# Patient Record
Sex: Female | Born: 1985 | Race: White | Hispanic: No | Marital: Single | Smoking: Current every day smoker
Health system: Southern US, Community
[De-identification: ages and names within clinical notes are randomized; demographics above are authoritative.]

## PROBLEM LIST (undated history)

## (undated) DIAGNOSIS — D649 Anemia, unspecified: Secondary | ICD-10-CM

## (undated) DIAGNOSIS — K259 Gastric ulcer, unspecified as acute or chronic, without hemorrhage or perforation: Secondary | ICD-10-CM

## (undated) HISTORY — PX: GASTRIC BYPASS: SHX52

---

## 2019-11-15 ENCOUNTER — Other Ambulatory Visit: Payer: Self-pay

## 2019-11-15 DIAGNOSIS — D649 Anemia, unspecified: Secondary | ICD-10-CM | POA: Insufficient documentation

## 2019-11-15 DIAGNOSIS — Z20822 Contact with and (suspected) exposure to covid-19: Secondary | ICD-10-CM | POA: Insufficient documentation

## 2019-11-15 DIAGNOSIS — K922 Gastrointestinal hemorrhage, unspecified: Secondary | ICD-10-CM | POA: Insufficient documentation

## 2019-11-15 DIAGNOSIS — R1012 Left upper quadrant pain: Secondary | ICD-10-CM | POA: Insufficient documentation

## 2019-11-15 DIAGNOSIS — F17200 Nicotine dependence, unspecified, uncomplicated: Secondary | ICD-10-CM | POA: Insufficient documentation

## 2019-11-15 LAB — POCT PREGNANCY, URINE: Preg Test, Ur: NEGATIVE

## 2019-11-15 MED ORDER — SODIUM CHLORIDE 0.9% FLUSH
3.0000 mL | Freq: Once | INTRAVENOUS | Status: AC
  Administered 2019-11-16: 3 mL via INTRAVENOUS

## 2019-11-15 NOTE — ED Triage Notes (Signed)
Pt arrives to ED via POV from home with c/o non-radiating LUQ abdominal pain x2 hrs. Pt denies N/V/D. No urinary cahnges or c/o's. Pt denies any c/o SHOB or CP. Pt reports h/x of gastric bypass and ulcers, but no other abdominal surgeries. Pt is A&O, in NAD; RR even, regular, and unlabored.

## 2019-11-16 ENCOUNTER — Emergency Department
Admission: EM | Admit: 2019-11-16 | Discharge: 2019-11-16 | Disposition: A | Discharge status: Other Acute Inpatient Hospital | Payer: Self-pay | Attending: Emergency Medicine | Admitting: Emergency Medicine

## 2019-11-16 ENCOUNTER — Emergency Department: Payer: Self-pay

## 2019-11-16 ENCOUNTER — Encounter: Payer: Self-pay | Admitting: Radiology

## 2019-11-16 DIAGNOSIS — K922 Gastrointestinal hemorrhage, unspecified: Secondary | ICD-10-CM

## 2019-11-16 DIAGNOSIS — R1012 Left upper quadrant pain: Secondary | ICD-10-CM

## 2019-11-16 DIAGNOSIS — K275 Chronic or unspecified peptic ulcer, site unspecified, with perforation: Secondary | ICD-10-CM

## 2019-11-16 DIAGNOSIS — D649 Anemia, unspecified: Secondary | ICD-10-CM

## 2019-11-16 HISTORY — DX: Gastric ulcer, unspecified as acute or chronic, without hemorrhage or perforation: K25.9

## 2019-11-16 HISTORY — DX: Anemia, unspecified: D64.9

## 2019-11-16 LAB — LACTIC ACID, PLASMA
Lactic Acid, Venous: 1 mmol/L (ref 0.5–1.9)
Lactic Acid, Venous: 1.1 mmol/L (ref 0.5–1.9)

## 2019-11-16 LAB — PROTIME-INR
INR: 1.1 (ref 0.8–1.2)
Prothrombin Time: 13.9 seconds (ref 11.4–15.2)

## 2019-11-16 LAB — CBC
HCT: 18.1 % — ABNORMAL LOW (ref 36.0–46.0)
HCT: 18.6 % — ABNORMAL LOW (ref 36.0–46.0)
HCT: 19.6 % — ABNORMAL LOW (ref 36.0–46.0)
Hemoglobin: 4.6 g/dL — CL (ref 12.0–15.0)
Hemoglobin: 5 g/dL — ABNORMAL LOW (ref 12.0–15.0)
Hemoglobin: 5.3 g/dL — ABNORMAL LOW (ref 12.0–15.0)
MCH: 16.9 pg — ABNORMAL LOW (ref 26.0–34.0)
MCH: 17.5 pg — ABNORMAL LOW (ref 26.0–34.0)
MCH: 19.1 pg — ABNORMAL LOW (ref 26.0–34.0)
MCHC: 25.4 g/dL — ABNORMAL LOW (ref 30.0–36.0)
MCHC: 26.9 g/dL — ABNORMAL LOW (ref 30.0–36.0)
MCHC: 27 g/dL — ABNORMAL LOW (ref 30.0–36.0)
MCV: 65.3 fL — ABNORMAL LOW (ref 80.0–100.0)
MCV: 66.5 fL — ABNORMAL LOW (ref 80.0–100.0)
MCV: 70.8 fL — ABNORMAL LOW (ref 80.0–100.0)
Platelets: 224 10*3/uL (ref 150–400)
Platelets: 314 10*3/uL (ref 150–400)
Platelets: 317 10*3/uL (ref 150–400)
RBC: 2.72 MIL/uL — ABNORMAL LOW (ref 3.87–5.11)
RBC: 2.77 MIL/uL — ABNORMAL LOW (ref 3.87–5.11)
RBC: 2.85 MIL/uL — ABNORMAL LOW (ref 3.87–5.11)
RDW: 18.1 % — ABNORMAL HIGH (ref 11.5–15.5)
RDW: 18.4 % — ABNORMAL HIGH (ref 11.5–15.5)
RDW: 20 % — ABNORMAL HIGH (ref 11.5–15.5)
WBC: 6.2 10*3/uL (ref 4.0–10.5)
WBC: 6.5 10*3/uL (ref 4.0–10.5)
WBC: 7.7 10*3/uL (ref 4.0–10.5)
nRBC: 0 % (ref 0.0–0.2)
nRBC: 0 % (ref 0.0–0.2)
nRBC: 0 % (ref 0.0–0.2)

## 2019-11-16 LAB — URINALYSIS, COMPLETE (UACMP) WITH MICROSCOPIC
Bacteria, UA: NONE SEEN
Bilirubin Urine: NEGATIVE
Glucose, UA: NEGATIVE mg/dL
Hgb urine dipstick: NEGATIVE
Ketones, ur: NEGATIVE mg/dL
Leukocytes,Ua: NEGATIVE
Nitrite: NEGATIVE
Protein, ur: NEGATIVE mg/dL
Specific Gravity, Urine: 1.025 (ref 1.005–1.030)
pH: 6 (ref 5.0–8.0)

## 2019-11-16 LAB — ABO/RH: ABO/RH(D): A POS

## 2019-11-16 LAB — COMPREHENSIVE METABOLIC PANEL
ALT: 19 U/L (ref 0–44)
AST: 17 U/L (ref 15–41)
Albumin: 3.2 g/dL — ABNORMAL LOW (ref 3.5–5.0)
Alkaline Phosphatase: 100 U/L (ref 38–126)
Anion gap: 9 (ref 5–15)
BUN: 21 mg/dL — ABNORMAL HIGH (ref 6–20)
CO2: 27 mmol/L (ref 22–32)
Calcium: 8.2 mg/dL — ABNORMAL LOW (ref 8.9–10.3)
Chloride: 101 mmol/L (ref 98–111)
Creatinine, Ser: 0.8 mg/dL (ref 0.44–1.00)
GFR calc Af Amer: >60 mL/min (ref >60)
GFR calc non Af Amer: >60 mL/min (ref >60)
Glucose, Bld: 120 mg/dL — ABNORMAL HIGH (ref 70–99)
Potassium: 4.2 mmol/L (ref 3.5–5.1)
Sodium: 137 mmol/L (ref 135–145)
Total Bilirubin: 0.5 mg/dL (ref 0.3–1.2)
Total Protein: 7.5 g/dL (ref 6.5–8.1)

## 2019-11-16 LAB — LIPASE, BLOOD: Lipase: 33 U/L (ref 11–51)

## 2019-11-16 LAB — PREGNANCY, URINE: Preg Test, Ur: NEGATIVE

## 2019-11-16 LAB — SARS CORONAVIRUS 2 BY RT PCR (HOSPITAL ORDER, PERFORMED IN ~~LOC~~ HOSPITAL LAB): SARS Coronavirus 2: NEGATIVE

## 2019-11-16 LAB — PREPARE RBC (CROSSMATCH)

## 2019-11-16 MED ORDER — SODIUM CHLORIDE 0.9 % IV SOLN
10.0000 mL/h | Freq: Once | INTRAVENOUS | Status: AC
  Administered 2019-11-16: 10 mL/h via INTRAVENOUS

## 2019-11-16 MED ORDER — IOHEXOL 9 MG/ML PO SOLN
500.0000 mL | Freq: Two times a day (BID) | ORAL | Status: DC | PRN
  Administered 2019-11-16: 500 mL via ORAL

## 2019-11-16 MED ORDER — ONDANSETRON HCL 4 MG/2ML IJ SOLN
4.0000 mg | Freq: Once | INTRAMUSCULAR | Status: AC
  Administered 2019-11-16: 4 mg via INTRAVENOUS
  Filled 2019-11-16: qty 2

## 2019-11-16 MED ORDER — MORPHINE SULFATE (PF) 4 MG/ML IV SOLN
INTRAVENOUS | Status: AC
  Filled 2019-11-16: qty 1

## 2019-11-16 MED ORDER — MORPHINE SULFATE (PF) 4 MG/ML IV SOLN
4.0000 mg | Freq: Once | INTRAVENOUS | Status: AC
  Administered 2019-11-16: 4 mg via INTRAVENOUS
  Filled 2019-11-16: qty 1

## 2019-11-16 MED ORDER — IOHEXOL 300 MG/ML  SOLN
100.0000 mL | Freq: Once | INTRAMUSCULAR | Status: AC | PRN
  Administered 2019-11-16: 100 mL via INTRAVENOUS

## 2019-11-16 MED ORDER — SODIUM CHLORIDE 0.9 % IV SOLN
80.0000 mg | Freq: Once | INTRAVENOUS | Status: AC
  Administered 2019-11-16: 80 mg via INTRAVENOUS
  Filled 2019-11-16: qty 80

## 2019-11-16 MED ORDER — SODIUM CHLORIDE 0.9 % IV SOLN
8.0000 mg/h | INTRAVENOUS | Status: DC
  Administered 2019-11-16: 8 mg/h via INTRAVENOUS
  Filled 2019-11-16 (×2): qty 80

## 2019-11-16 MED ORDER — SODIUM CHLORIDE 0.9 % IV BOLUS
1000.0000 mL | Freq: Once | INTRAVENOUS | Status: AC
  Administered 2019-11-16: 1000 mL via INTRAVENOUS

## 2019-11-16 MED ORDER — PIPERACILLIN-TAZOBACTAM 3.375 G IVPB 30 MIN
3.3750 g | Freq: Once | INTRAVENOUS | Status: AC
  Administered 2019-11-16: 3.375 g via INTRAVENOUS
  Filled 2019-11-16: qty 50

## 2019-11-16 MED ORDER — MORPHINE SULFATE (PF) 4 MG/ML IV SOLN
4.0000 mg | Freq: Once | INTRAVENOUS | Status: AC
  Administered 2019-11-16: 4 mg via INTRAVENOUS
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
## 2019-11-16 NOTE — ED Notes (Signed)
As per Dr. Dolores Frame. Second PRBCs to be HELD and not given at this time.

## 2019-11-16 NOTE — ED Provider Notes (Signed)
[facility] Emergency Department Provider Note   ____________________________________________   First MD Initiated Contact with Patient 11/16/19 0017     (approximate)  I have reviewed the triage vital signs and the nursing notes.   HISTORY  Chief Complaint Abdominal Pain    Tara Henry is a 34 y.o. female who presents to the ED from home with a chief complaint of abdominal pain.  Patient reports nonradiating left upper quadrant abdominal pain for the past several hours.  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  Denies fever, chills, chest pain, shortness of breath, nausea, vomiting or bloody stools.  Denies NSAID use.  Denies EtOH.     Patient had EGD done at [facility] in 02/2018 which demonstrated Roux-en-Y gastrojejunostomy with gastrojejunal anastomosis characterized by erosion, ulceration and intact staple line.   She was treated with Prilosec.   Past Medical History:  Diagnosis Date  . Anemia   . Stomach ulcer     There are no problems to display for this patient.   Past Surgical History:  Procedure Laterality Date  . GASTRIC BYPASS      Prior to Admission medications   Not on File    Allergies Patient has no known allergies.  No family history on file.  Social History Social History   Tobacco Use  . Smoking status: Current Every Day Smoker  . Smokeless tobacco: Never Used  Substance Use Topics  . Alcohol use: Not on file  . Drug use: Not on file    Review of Systems  Constitutional: No fever/chills Eyes: No visual changes. ENT: No sore throat. Cardiovascular: Denies chest pain. Respiratory: Denies shortness of breath. Gastrointestinal: Positive for abdominal pain.  No nausea, no vomiting.  No diarrhea.  No constipation. Genitourinary: Negative for dysuria. Musculoskeletal: Negative for back pain. Skin: Negative for rash. Neurological: Negative for headaches, focal weakness or  numbness.   ____________________________________________   PHYSICAL EXAM:  VITAL SIGNS: ED Triage Vitals  Enc Vitals Group     BP 11/15/19 2332 121/81     Pulse Rate 11/15/19 2332 97     Resp 11/15/19 2332 18     Temp 11/15/19 2332 98.7 F (37.1 C)     Temp Source 11/15/19 2332 Oral     SpO2 11/15/19 2332 100 %     Weight 11/15/19 2333 130 lb (59 kg)     Height 11/15/19 2333 5\' 2"  (1.575 m)     Head Circumference --      Peak Flow --      Pain Score 11/15/19 2342 10     Pain Loc --      Pain Edu? --      Excl. in GC? --     Constitutional: Alert and oriented.  Cachectic appearing and in mild acute distress. Eyes: Conjunctivae are normal. PERRL. EOMI. Head: Atraumatic. Nose: No congestion/rhinnorhea. Mouth/Throat: Mucous membranes are moist.   Neck: No stridor.   Cardiovascular: Normal rate, regular rhythm. Grossly normal heart sounds.  Good peripheral circulation. Respiratory: Normal respiratory effort.  No retractions. Lungs CTAB. Gastrointestinal: Soft and moderately tender to palpation left upper quadrant without rebound or guarding. No distention. No abdominal bruits. No CVA tenderness. Rectal: External exam unremarkable.  Brown stool on gloved finger which is heme positive. Musculoskeletal: No lower extremity tenderness nor edema.  No joint effusions. Neurologic:  Normal speech and language. No gross focal neurologic deficits are appreciated. No gait instability. Skin:  Skin is warm, dry and intact. No rash noted. Psychiatric: Mood and affect are normal. Speech and behavior are normal.  ____________________________________________   LABS (all labs ordered are listed, but only abnormal results are displayed)  Labs Reviewed  COMPREHENSIVE METABOLIC PANEL - Abnormal; Notable for the following components:      Result Value   Glucose, Bld 120 (*)    BUN 21 (*)    Calcium 8.2 (*)    Albumin 3.2 (*)    All other components within normal limits  CBC - Abnormal;  Notable for the following components:   RBC 2.72 (*)    Hemoglobin 4.6 (*)    HCT 18.1 (*)    MCV 66.5 (*)    MCH 16.9 (*)    MCHC 25.4 (*)    RDW 18.1 (*)    All other components within normal limits  URINALYSIS, COMPLETE (UACMP) WITH MICROSCOPIC - Abnormal; Notable for the following components:   Color, Urine YELLOW (*)    APPearance HAZY (*)    All other components within normal limits  CBC - Abnormal; Notable for the following components:   RBC 2.85 (*)    Hemoglobin 5.0 (*)    HCT 18.6 (*)    MCV 65.3 (*)    MCH 17.5 (*)    MCHC 26.9 (*)    RDW 18.4 (*)    All other components within normal limits  CBC - Abnormal; Notable for the following components:   RBC 2.77 (*)    Hemoglobin 5.3 (*)    HCT 19.6 (*)    MCV 70.8 (*)    MCH 19.1 (*)    MCHC 27.0 (*)    RDW 20.0 (*)    All other components within normal limits  SARS CORONAVIRUS 2 BY RT PCR (HOSPITAL ORDER, PERFORMED IN [facility])  CULTURE, BLOOD (ROUTINE X 2)  CULTURE, BLOOD (ROUTINE X 2)  LIPASE, BLOOD  PROTIME-INR  PREGNANCY, URINE  LACTIC ACID, PLASMA  LACTIC ACID, PLASMA  POCT PREGNANCY, URINE  TYPE AND SCREEN  PREPARE RBC (CROSSMATCH)  ABO/RH   ____________________________________________  EKG  ED ECG REPORT I, Glendia Olshefski J, the attending physician, personally viewed and interpreted this ECG.   Date: 11/16/2019  EKG Time: 0114  Rate: 80  Rhythm: normal EKG, normal sinus rhythm  Axis: RAD  Intervals:none  ST&T Change: Nonspecific  ____________________________________________  RADIOLOGY  ED MD interpretation: Retroperitoneal gas concerning for perforated ulcer in the afferent limb, anasarca, cholelithiasis  Official radiology report(s): CT Abdomen Pelvis W Contrast  Result Date: 11/16/2019 CLINICAL DATA:  Left upper quadrant pain for 2 hours EXAM: CT ABDOMEN AND PELVIS WITH CONTRAST TECHNIQUE: Multidetector CT imaging of the abdomen and pelvis was performed using the standard  protocol following bolus administration of intravenous contrast. Patient scanned in a right lateral decubitus position. CONTRAST:  OMNIPAQUE IOHEXOL 300 MG/ML  SOLN COMPARISON:  None FINDINGS: Lower chest: Lung bases are clear. Normal heart size. No pericardial effusion. Hepatobiliary: Focal fatty infiltration along the falciform ligament. Normal hepatic attenuation. Smooth liver surface contour. No worrisome liver lesions. Gallbladder appears largely contracted around numerous calcified gallstones with some mild pericholecystic fluid, this may be redistributed or reactive. Pancreas: Unremarkable. No pancreatic ductal dilatation or surrounding inflammatory changes. Spleen: Normal in size without focal abnormality. Adrenals/Urinary Tract: Normal adrenal glands. Kidneys are normally located with symmetric enhancement. No suspicious renal lesion, urolithiasis or hydronephrosis. Mild thickening of the bladder wall may be related to underdistention. Stomach/Bowel: Bowel is  poorly assessed due to diffuse edematous changes throughout the mesentery and a collapsed appearance of much of the bowel. There are postsurgical changes from what appear to be in anti colic Roux-en-Y gastric bypass. There is irregular mucosal hyperemia and possible thickening of the excluded gastric segment of the biliopancreatic limb with a layering air and fluid collection about the duodenum with foci of gas tracking inferiorly from this collection along the right aortic margin. Could reflect sequela of a perforated ulcer within the afferent limb. Much of the distal small bowel is decompressed. Moderate colonic stool burden. No frank colonic wall thickening or dilatation. Foci of gas seen tracking into the posterior retroperitoneum inferiorly as well. Vascular/Lymphatic: No significant vascular findings are present. Adenopathy is difficult to assess given the diffuse edematous changes. Pathologically enlarged nodes in the abdomen or pelvis.  Reproductive: Anteverted uterus with IUD in appropriate position. No concerning adnexal lesions. Other: Diffuse severe body wall and mesenteric edematous changes. Free air largely contained within the retroperitoneum as above with an air and fluid containing collection about the 1st to 2nd portions of the duodenum and adjacent the gallbladder fossa (7/32) measuring approximately 4.8 x 2.1 x 3 cm in size. No bowel containing hernias. Musculoskeletal: Levocurvature of the lower lumbar spine. No acute osseous abnormality or suspicious osseous lesion. IMPRESSION: 1. Postsurgical changes from antecolic Roux-en-Y gastric bypass. 2. There is irregular mucosal hyperemia and possible thickening of the excluded gastric segment of the biliopancreatic limb with poor definition of the duodenum with a layering air and fluid containing the second portion of the duodenum with foci of gas tracking inferiorly predominately within the retroperitoneum and extending along the right aortic margin. Could reflect sequela of a perforated ulcer within the afferent limb. 3. Diffuse severe body wall and mesenteric edematous changes, consistent with anasarca. 4. Cholelithiasis with some mild pericholecystic fluid, this may be redistributed or reactive. If there is clinical concern for acute cholecystitis, consider further evaluation with right upper quadrant ultrasound. 5. Mild thickening of the bladder wall may be related to underdistention. Correlate with urinalysis to exclude cystitis. Critical Value/emergent results were called by telephone at the time of interpretation on 11/16/2019 at 3:10 am to provider Allani Reber , who verbally acknowledged these results. Electronically Signed   By: Kreg Shropshire M.D.   On: 11/16/2019 03:11    ____________________________________________   PROCEDURES  Procedure(s) performed (including Critical Care):  .1-3 Lead EKG Interpretation Performed by: Irean Hong, MD Authorized by: Irean Hong, MD      Interpretation: normal     ECG rate:  95   ECG rate assessment: normal     Rhythm: sinus rhythm     Ectopy: none     Conduction: normal   Comments:     Patient placed on cardiac monitor to evaluate for arrhythmias   CRITICAL CARE Performed by: Irean Hong   Total critical care time: 60 minutes  Critical care time was exclusive of separately billable procedures and treating other patients.  Critical care was necessary to treat or prevent imminent or life-threatening deterioration.  Critical care was time spent personally by me on the following activities: development of treatment plan with patient and/or surrogate as well as nursing, discussions with consultants, evaluation of patient's response to treatment, examination of patient, obtaining history from patient or surrogate, ordering and performing treatments and interventions, ordering and review of laboratory studies, ordering and review of radiographic studies, pulse oximetry and re-evaluation of patient's condition.   ____________________________________________  INITIAL IMPRESSION / ASSESSMENT AND PLAN / ED COURSE  As part of my medical decision making, I reviewed the following data within the electronic MEDICAL RECORD NUMBER Nursing notes reviewed and incorporated, Labs reviewed, EKG interpreted, Old chart reviewed, Radiograph reviewed and Notes from prior ED visits     Larose Hiresshley E Vandyne was evaluated in Emergency Department on 11/16/2019 for the symptoms described in the history of present illness. She was evaluated in the context of the global COVID-19 pandemic, which necessitated consideration that the patient might be at risk for infection with the SARS-CoV-2 virus that causes COVID-19. Institutional protocols and algorithms that pertain to the evaluation of patients at risk for COVID-19 are in a state of rapid change based on information released by regulatory bodies including the CDC and federal and state organizations. These  policies and algorithms were followed during the patient's care in the ED.    34 year old female with PUD presenting with LUQ pain. Differential diagnosis includes, but is not limited to, biliary disease (biliary colic, acute cholecystitis, cholangitis, choledocholithiasis, etc), intrathoracic causes for epigastric abdominal pain including ACS, gastritis, duodenitis, pancreatitis, small bowel or large bowel obstruction, abdominal aortic aneurysm, hernia, and ulcer(s).  Initial H/H 4.6/18.1.  Will repeat CBC.  Obtain type and screen, PT/INR, Covid screen.  Will obtain CT abdomen/pelvis.  If there is another suggestion of inflammation/erosion at the gastrojejunal anastomosis, the patient may be better served with [facility].   Clinical Course as of Nov 16 615  Tue Nov 16, 2019  [id number] Discussed case with surgery on-call Dr. Lady Garyannon who [facility] as we do not have bariatric services at this facility.  Agrees with dose of IV Zosyn.  [facility] transfer center contacted.   [JS]  [id number] [facility] Dr[facility] Ferd GlassingFarrell who recommends ED to ED transfer.  Accepted by Dr. Alvino Chapelhoi.  Patient updated.  Will check H/H after first unit PRBCs.   [JS]  [id number] Transport unavailable until after 7 AM.  Hemoglobin still under 7; will transfuse second unit PRBC.   [JS]    Clinical Course User Index [JS] Irean HongSung, Marlet Korte J, MD     ____________________________________________   FINAL CLINICAL IMPRESSION(S) / ED DIAGNOSES  Final diagnoses:  Left upper quadrant abdominal pain  UGIB (upper gastrointestinal bleed)  Anemia, unspecified type  Perforated ulcer [facility](HCC)     ED Discharge Orders    None       Note:  This document was prepared using Dragon voice recognition software and may include unintentional dictation errors.   Irean HongSung, Adriane Gabbert J, MD 11/16/19 [id number]

## 2019-11-16 NOTE — Progress Notes (Signed)
Patient with 1 day history of abdominal pain. Has history of RNY GBP with prior care at [facility] for anastomotic ulceration.  CT concerning for ulcer, possible perforation.  Patient with hgb of 5, normal WBC and normal vital signs.  Discussed with ED provider, Dr. Dolores Frame.  As this appears to be a complication related to prior bariatric surgery and she is hemodynamically stable, recommend transfer to [facility] for further evaluation and management.  Agree with transfusion and empiric broad-spectrum abx.

## 2019-11-16 NOTE — ED Notes (Signed)
Second unit of PRBCs to be given as per ER Provider

## 2019-11-17 LAB — TYPE AND SCREEN
ABO/RH(D): A POS
Antibody Screen: NEGATIVE
Unit division: 0
Unit division: 0

## 2019-11-17 LAB — BPAM RBC
Blood Product Expiration Date: 202107082359
Blood Product Expiration Date: 202107102359
ISSUE DATE / TIME: 202107060220
ISSUE DATE / TIME: 202107060548
Unit Type and Rh: 5100
Unit Type and Rh: 6200

## 2019-11-17 NOTE — ED Notes (Signed)
Positive blood culture called to [place] at [facility].

## 2019-11-19 LAB — CULTURE, BLOOD (ROUTINE X 2)

## 2019-11-21 LAB — CULTURE, BLOOD (ROUTINE X 2): Culture: NO GROWTH

## 2020-01-18 ENCOUNTER — Emergency Department: Payer: Medicaid Other

## 2020-01-18 ENCOUNTER — Encounter: Payer: Self-pay | Admitting: Emergency Medicine

## 2020-01-18 ENCOUNTER — Other Ambulatory Visit: Payer: Self-pay

## 2020-01-18 ENCOUNTER — Emergency Department
Admission: EM | Admit: 2020-01-18 | Discharge: 2020-01-18 | Payer: Medicaid Other | Attending: Emergency Medicine | Admitting: Emergency Medicine

## 2020-01-18 DIAGNOSIS — R109 Unspecified abdominal pain: Secondary | ICD-10-CM | POA: Diagnosis present

## 2020-01-18 DIAGNOSIS — Z79899 Other long term (current) drug therapy: Secondary | ICD-10-CM | POA: Insufficient documentation

## 2020-01-18 DIAGNOSIS — Z91040 Latex allergy status: Secondary | ICD-10-CM | POA: Diagnosis not present

## 2020-01-18 DIAGNOSIS — R509 Fever, unspecified: Secondary | ICD-10-CM | POA: Insufficient documentation

## 2020-01-18 DIAGNOSIS — K251 Acute gastric ulcer with perforation: Secondary | ICD-10-CM | POA: Insufficient documentation

## 2020-01-18 DIAGNOSIS — A419 Sepsis, unspecified organism: Secondary | ICD-10-CM | POA: Insufficient documentation

## 2020-01-18 DIAGNOSIS — Z20822 Contact with and (suspected) exposure to covid-19: Secondary | ICD-10-CM | POA: Diagnosis not present

## 2020-01-18 DIAGNOSIS — K922 Gastrointestinal hemorrhage, unspecified: Secondary | ICD-10-CM

## 2020-01-18 DIAGNOSIS — K275 Chronic or unspecified peptic ulcer, site unspecified, with perforation: Secondary | ICD-10-CM

## 2020-01-18 LAB — POCT PREGNANCY, URINE: Preg Test, Ur: NEGATIVE

## 2020-01-18 LAB — BLOOD CULTURE ID PANEL (REFLEXED) - BCID2

## 2020-01-18 LAB — URINALYSIS, COMPLETE (UACMP) WITH MICROSCOPIC
Bacteria, UA: NONE SEEN
Bilirubin Urine: NEGATIVE
Glucose, UA: NEGATIVE mg/dL
Hgb urine dipstick: NEGATIVE
Ketones, ur: NEGATIVE mg/dL
Leukocytes,Ua: NEGATIVE
Nitrite: NEGATIVE
Protein, ur: NEGATIVE mg/dL
Specific Gravity, Urine: 1.011 (ref 1.005–1.030)
pH: 5 (ref 5.0–8.0)

## 2020-01-18 LAB — SARS CORONAVIRUS 2 BY RT PCR (HOSPITAL ORDER, PERFORMED IN ~~LOC~~ HOSPITAL LAB): SARS Coronavirus 2: NEGATIVE

## 2020-01-18 LAB — COMPREHENSIVE METABOLIC PANEL
ALT: 10 U/L (ref 0–44)
AST: 14 U/L — ABNORMAL LOW (ref 15–41)
Albumin: 2.4 g/dL — ABNORMAL LOW (ref 3.5–5.0)
Alkaline Phosphatase: 66 U/L (ref 38–126)
Anion gap: 9 (ref 5–15)
BUN: 11 mg/dL (ref 6–20)
CO2: 25 mmol/L (ref 22–32)
Calcium: 7.8 mg/dL — ABNORMAL LOW (ref 8.9–10.3)
Chloride: 100 mmol/L (ref 98–111)
Creatinine, Ser: 0.38 mg/dL — ABNORMAL LOW (ref 0.44–1.00)
GFR calc Af Amer: >60 mL/min (ref >60)
GFR calc non Af Amer: >60 mL/min (ref >60)
Glucose, Bld: 108 mg/dL — ABNORMAL HIGH (ref 70–99)
Potassium: 4 mmol/L (ref 3.5–5.1)
Sodium: 134 mmol/L — ABNORMAL LOW (ref 135–145)
Total Bilirubin: 0.5 mg/dL (ref 0.3–1.2)
Total Protein: 6.9 g/dL (ref 6.5–8.1)

## 2020-01-18 LAB — LACTIC ACID, PLASMA
Lactic Acid, Venous: 1.6 mmol/L (ref 0.5–1.9)
Lactic Acid, Venous: 1.3 mmol/L (ref 0.5–1.9)

## 2020-01-18 LAB — PROTIME-INR
INR: 1.3 — ABNORMAL HIGH (ref 0.8–1.2)
Prothrombin Time: 16.1 seconds — ABNORMAL HIGH (ref 11.4–15.2)

## 2020-01-18 LAB — LIPASE, BLOOD: Lipase: 37 U/L (ref 11–51)

## 2020-01-18 LAB — APTT: aPTT: 36 s (ref 24–36)

## 2020-01-18 LAB — HCG, QUANTITATIVE, PREGNANCY: hCG, Beta Chain, Quant, S: <1 m[IU]/mL (ref <5)

## 2020-01-18 MED ORDER — LACTATED RINGERS IV BOLUS
1000.0000 mL | Freq: Once | INTRAVENOUS | Status: AC
  Administered 2020-01-18: 1000 mL via INTRAVENOUS

## 2020-01-18 MED ORDER — SODIUM CHLORIDE 0.9 % IV SOLN
8.0000 mg/h | INTRAVENOUS | Status: DC
  Administered 2020-01-18: 8 mg/h via INTRAVENOUS
  Filled 2020-01-18: qty 80

## 2020-01-18 MED ORDER — SODIUM CHLORIDE 0.9 % IV SOLN
10.0000 mL/h | Freq: Once | INTRAVENOUS | Status: AC
  Administered 2020-01-18: 10 mL/h via INTRAVENOUS

## 2020-01-18 MED ORDER — ONDANSETRON HCL 4 MG/2ML IJ SOLN
4.0000 mg | Freq: Once | INTRAMUSCULAR | Status: AC
  Administered 2020-01-18: 4 mg via INTRAVENOUS
  Filled 2020-01-18: qty 2

## 2020-01-18 MED ORDER — PANTOPRAZOLE SODIUM 40 MG IV SOLR: 40.0000 mg | Freq: Two times a day (BID) | INTRAVENOUS | Status: DC

## 2020-01-18 MED ORDER — LACTATED RINGERS IV SOLN: INTRAVENOUS | Status: DC

## 2020-01-18 MED ORDER — ACETAMINOPHEN 500 MG PO TABS
1000.0000 mg | ORAL_TABLET | Freq: Once | ORAL | Status: AC
  Administered 2020-01-18: 1000 mg via ORAL
  Filled 2020-01-18: qty 2

## 2020-01-18 MED ORDER — IOHEXOL 300 MG/ML  SOLN
100.0000 mL | Freq: Once | INTRAMUSCULAR | Status: AC | PRN
  Administered 2020-01-18: 100 mL via INTRAVENOUS

## 2020-01-18 MED ORDER — PIPERACILLIN-TAZOBACTAM 3.375 G IVPB 30 MIN
3.3750 g | Freq: Once | INTRAVENOUS | Status: AC
  Administered 2020-01-18: 3.375 g via INTRAVENOUS
  Filled 2020-01-18: qty 50

## 2020-01-18 MED ORDER — HYDROMORPHONE HCL 1 MG/ML IJ SOLN
1.0000 mg | Freq: Once | INTRAMUSCULAR | Status: AC
  Administered 2020-01-18: 1 mg via INTRAVENOUS
  Filled 2020-01-18: qty 1

## 2020-01-18 MED ORDER — SODIUM CHLORIDE 0.9 % IV SOLN
80.0000 mg | Freq: Once | INTRAVENOUS | Status: AC
  Administered 2020-01-18: 80 mg via INTRAVENOUS
  Filled 2020-01-18: qty 80

## 2020-01-18 MED ORDER — MORPHINE SULFATE (PF) 4 MG/ML IV SOLN
4.0000 mg | Freq: Once | INTRAVENOUS | Status: AC
  Administered 2020-01-18: 4 mg via INTRAVENOUS
  Filled 2020-01-18: qty 1

## 2020-01-18 NOTE — Progress Notes (Signed)
CODE SEPSIS - PHARMACY COMMUNICATION  **Broad Spectrum Antibiotics should be administered within 1 hour of Sepsis diagnosis**  Time Code Sepsis Called/Page Received: 0707  Antibiotics Ordered: zosyn  Time of 1st antibiotic administration: 0509  Additional action taken by pharmacy:   If necessary, Name of Provider/Nurse Contacted:     Thomasene Ripple ,PharmD Clinical Pharmacist  01/18/2020  7:14 AM

## 2020-01-18 NOTE — ED Triage Notes (Signed)
Pt to triage via w/c; EMS brought pt in from home; left AMA mo ago for GI bleeding; abd distended and firm, pitting edema to extremities; pt appears uncomfortable, restless; c/o left sided abd & back pain x 3 days accomp by vomiting blood and rectal bleeding

## 2020-01-18 NOTE — ED Notes (Signed)
Report given to [facility] ED [facility]

## 2020-01-18 NOTE — ED Notes (Signed)
Pt had urinated on herself. Pt cleaned up and clean gown and brief placed. Pt given warm blankets

## 2020-01-18 NOTE — ED Notes (Signed)
Pt placed in gown and given warm blankets. External cath placed.

## 2020-01-18 NOTE — ED Notes (Signed)
Paper consent to transfer signed and in chart

## 2020-01-18 NOTE — ED Provider Notes (Signed)
-----------------------------------------   8:02 AM on 01/18/2020 -----------------------------------------  ED ECG REPORT I, Merwyn Katos, the attending physician, personally viewed and interpreted this ECG.  Date: 01/18/2020 EKG Time: 0751 Rate: 102 Rhythm: normal sinus rhythm QRS Axis: normal Intervals: normal ST/T Wave abnormalities: normal Narrative Interpretation: no evidence of acute ischemia

## 2020-01-18 NOTE — ED Notes (Signed)
EMTALA reviewed by charge RN 

## 2020-01-18 NOTE — ED Provider Notes (Signed)
[facility] Emergency Department Provider Note  ____________________________________________  Time seen: Approximately 5:18 AM  I have reviewed the triage vital signs and the nursing notes.   HISTORY  Chief Complaint Abdominal Pain   HPI CESIA ORF is a 34 y.o. female with history of gastric bypass and peptic ulcer disease who presents for evaluation of abdominal pain.  Patient reports 4 days of severe diffuse sharp abdominal pain associated with nausea inability to tolerate p.o.  She reports progressively worsening abdominal distention for the last 2 weeks.  Has had intermittent bloody stools.  Vomited 2 days ago and reports hematemesis.  Denies coffee-ground emesis.  Denies melena.  Her pain is severe, constant and nonradiating.  No chest pain or shortness of breath.  Has had a fever for the last 2 days.  No dysuria or hematuria.   Reports having a normal bowel movement today.  Passing flatus.  Past Medical History:  Diagnosis Date  . Anemia   . Stomach ulcer     There are no problems to display for this patient.   Past Surgical History:  Procedure Laterality Date  . GASTRIC BYPASS      Prior to Admission medications   Medication Sig Start Date End Date Taking? Authorizing Provider  buprenorphine-naloxone (SUBOXONE) 8-2 mg SUBL SL tablet Place 1 tablet under the tongue 3 (three) times daily. 12/23/19   [provider]  CARAFATE 1 g tablet Take 1 g by mouth 4 (four) times daily. 12/08/19   [provider]  gabapentin (NEURONTIN) 300 MG capsule Take 900 mg by mouth 3 (three) times daily. 11/23/19   [provider]  hydrOXYzine (ATARAX/VISTARIL) 25 MG tablet Take 25 mg by mouth every 6 (six) hours as needed. 09/01/19   [provider]  omeprazole (PRILOSEC) 20 MG capsule Take 20 mg by mouth 2 (two) times daily. 11/18/19 11/17/20  [provider]    Allergies Latex  No family history on file.  Social  History Smoking - yes Alcohol - no Drugs - former IVDU  Review of Systems  Constitutional: + fever. Eyes: Negative for visual changes. ENT: Negative for sore throat. Neck: No neck pain  Cardiovascular: Negative for chest pain. Respiratory: Negative for shortness of breath. Gastrointestinal: + abdominal pain, distention, nausea, and vomiting. No diarrhea. Genitourinary: Negative for dysuria. Musculoskeletal: Negative for back pain. Skin: Negative for rash. Neurological: Negative for headaches, weakness or numbness. Psych: No SI or HI  ____________________________________________   PHYSICAL EXAM:  VITAL SIGNS: ED Triage Vitals  Enc Vitals Group     BP 01/18/20 0354 (!) 142/82     Pulse Rate 01/18/20 0354 (!) 139     Resp 01/18/20 0354 (!) 22     Temp 01/18/20 0354 (!) 101.8 F (38.8 C)     Temp Source 01/18/20 0354 Oral     SpO2 01/18/20 0354 97 %     Weight 01/18/20 0355 130 lb (59 kg)     Height 01/18/20 0355 5\' 2"  (1.575 m)     Head Circumference --      Peak Flow --      Pain Score 01/18/20 0358 10     Pain Loc --      Pain Edu? --      Excl. in GC? --     Constitutional: Alert and oriented, moderate distress, ill appearing, pale HEENT:      Head: Normocephalic and atraumatic.         Eyes: Conjunctivae  are normal. Sclera is non-icteric.       Mouth/Throat: Mucous membranes are dry.       Neck: Supple with no signs of meningismus. Cardiovascular: Tachycardic with regular rhythm Respiratory: Normal respiratory effort. Lungs are clear to auscultation bilaterally.  Gastrointestinal: Abdomen is tense, distended, with guarding, and generalized tenderness to palpation , positive bowel sounds.  Rectal exam showing brown stool weakly guaiac positive. Musculoskeletal: No edema, cyanosis, or erythema of extremities. Neurologic: Normal speech and language. Face is symmetric. Moving all extremities. No gross focal neurologic deficits are appreciated. Skin: Skin is warm,  dry and intact.  Psychiatric: Mood and affect are normal. Speech and behavior are normal.  ____________________________________________   LABS (all labs ordered are listed, but only abnormal results are displayed)  Labs Reviewed  CBC WITH DIFFERENTIAL/PLATELET - Abnormal; Notable for the following components:      Result Value   WBC 11.0 (*)    RBC 1.92 (*)    Hemoglobin 3.4 (*)    HCT 13.2 (*)    MCV 68.8 (*)    MCH 17.7 (*)    MCHC 25.8 (*)    RDW 19.2 (*)    Platelets 412 (*)    nRBC 0.4 (*)    Neutro Abs 8.9 (*)    Monocytes Absolute 1.1 (*)    Abs Immature Granulocytes 0.09 (*)    All other components within normal limits  COMPREHENSIVE METABOLIC PANEL - Abnormal; Notable for the following components:   Sodium 134 (*)    Glucose, Bld 108 (*)    Creatinine, Ser 0.38 (*)    Calcium 7.8 (*)    Albumin 2.4 (*)    AST 14 (*)    All other components within normal limits  URINALYSIS, COMPLETE (UACMP) WITH MICROSCOPIC - Abnormal; Notable for the following components:   Color, Urine YELLOW (*)    APPearance CLEAR (*)    All other components within normal limits  SARS CORONAVIRUS 2 BY RT PCR (HOSPITAL ORDER, PERFORMED IN [facility])  CULTURE, BLOOD (ROUTINE X 2)  CULTURE, BLOOD (ROUTINE X 2)  LIPASE, BLOOD  LACTIC ACID, PLASMA  HCG, QUANTITATIVE, PREGNANCY  LACTIC ACID, PLASMA  PROTIME-INR  APTT  POC URINE PREG, ED  POCT PREGNANCY, URINE  TYPE AND SCREEN  PREPARE RBC (CROSSMATCH)   ____________________________________________  EKG  none  ____________________________________________  RADIOLOGY  I have personally reviewed the images performed during this visit and I agree with the Radiologist's read.   Interpretation by Radiologist:  CT ABDOMEN PELVIS W CONTRAST  Result Date: 01/18/2020 CLINICAL DATA:  Abdominal pain, unspecified. Left-sided abdominal back pain. Hematemesis. Rectal bleeding. EXAM: CT ABDOMEN AND PELVIS WITH CONTRAST TECHNIQUE:  Multidetector CT imaging of the abdomen and pelvis was performed using the standard protocol following bolus administration of intravenous contrast. CONTRAST:  OMNIPAQUE IOHEXOL 300 MG/ML  SOLN COMPARISON:  CT of the abdomen and pelvis 11/16/2019 FINDINGS: Lower chest: The lung bases are clear without focal nodule, mass, or airspace disease. Heart size is normal. No significant pleural or pericardial effusion is present. Hepatobiliary: Liver is unremarkable. Gallstones are again noted. The common bile duct and gallbladder are within normal limits. Pancreas: Unremarkable. No pancreatic ductal dilatation or surrounding inflammatory changes. Spleen: Spleen is mildly enlarged. Adrenals/Urinary Tract: Adrenal glands are unremarkable. Kidneys are normal, without renal calculi, focal lesion, or hydronephrosis. Bladder is unremarkable. Stomach/Bowel: Postsurgical changes of gastric bypass are again noted. Anastomosis is intact. Mildly distended loops of small bowel are noted  proximally. Distal small bowel is within normal limits. The appendix is not discretely visualized. Moderate stool is present in the ascending colon. Loops of transverse colon are dilated. Descending and sigmoid colon are unremarkable. Vascular/Lymphatic: No significant vascular findings are present. No enlarged abdominal or pelvic lymph nodes. Reproductive: IUD is in place. Uterus and adnexa are otherwise unremarkable. Other: Increased diffuse abdominal ascites are present. There is some peritoneal enhancement. Layering density is noted on the right without extension into the anatomic pelvis. No free air is present. No mass lesion is present. Musculoskeletal: Degenerative endplate changes are present in the lumbar spine. No focal lytic or blastic lesions are present. Bony pelvis is within normal limits. The hips are located and within normal limits. IMPRESSION: 1. Increased diffuse abdominal ascites with some peritoneal enhancement. This raises  concern for peritonitis. 2. Hyperdense material layering in the right posterior peritoneum raise concern for blood products. Although no free air is present, ulceration is considered. 3. Mildly distended loops of small bowel and transverse colon are likely related to ileus. 4. Postsurgical changes of gastric bypass. 5. Cholelithiasis without evidence for cholecystitis. 6. Mild splenomegaly. Critical Value/emergent results were called by telephone at the time of interpretation on 01/18/2020 at 6:18 am to provider [facility] , who verbally acknowledged these results. Electronically Signed   By: Marin Roberts M.D.   On: 01/18/2020 06:18      ____________________________________________   PROCEDURES  Procedure(s) performed:yes .1-3 Lead EKG Interpretation Performed by: Nita Sickle, MD Authorized by: Nita Sickle, MD     Interpretation: non-specific     ECG rate assessment: tachycardic     Rhythm: sinus tachycardia     Ectopy: none     Critical Care performed: yes  CRITICAL CARE Performed by: Nita Sickle  ?  Total critical care time: 60 min  Critical care time was exclusive of separately billable procedures and treating other patients.  Critical care was necessary to treat or prevent imminent or life-threatening deterioration.  Critical care was time spent personally by me on the following activities: development of treatment plan with patient and/or surrogate as well as nursing, discussions with consultants, evaluation of patient's response to treatment, examination of patient, obtaining history from patient or surrogate, ordering and performing treatments and interventions, ordering and review of laboratory studies, ordering and review of radiographic studies, pulse oximetry and re-evaluation of patient's condition.  ____________________________________________   INITIAL IMPRESSION / ASSESSMENT AND PLAN / ED COURSE  34 y.o. female with history of  gastric bypass and peptic ulcer disease who presents for evaluation of abdominal pain, distention, nausea, vomiting, fever.  Patient looks ill, pale, abdomen is severely distended with guarding and generalized tenderness, rectal exam showing brown stool weakly guaiac positive.  She has no history of cirrhosis, hepatitis, or alcohol abuse.  Former history of IV drug use.  Patient is tachycardic, tachypneic, and febrile on arrival to the emergency room meeting sepsis criteria.  Surgical abdomen on exam. Possible differential diagnosis including perforated ulcer versus SBO versus colitis versus diverticulitis versus dehiscence of anastomosis site versus intra-abdominal abscess versus pyelonephritis versus UTI versus possible new onset cirrhosis with SBP versus bowel ischemia.  CT abdomen pelvis pending.  Lactic's normal.  CBC is pending.  LFTs normal, lipase normal.  CT abdomen pelvis pending.  Patient was started on sepsis protocol with IV Zosyn, fluids, Tylenol.  She was given IV morphine and Zofran for symptom relief. Protonix bolus and drip.  Old medical records reviewed.  _________________________ 6:33 AM on  01/18/2020 -----------------------------------------  CT concerning for possible perforated ulcer with free blood in the abdomen.  Discussed with Dr. Hazle Quantintron-Diaz, our surgeon on-call, who feels patient needs to be transferred to tertiary care due to history of gastric bypass.  [facility].  Patient CBC showing severe anemia with a hemoglobin of 3.4.  Will give 2 units of emergent release blood and type and cross for 2 more.  _________________________ 7:05 AM on 01/18/2020 -----------------------------------------  Dr. Hazle Quantintron-Diaz has evaluated patient emergency room and agrees with continual attempts to transfer her to tertiary care center.  I spoke with [facility] Dolan AmenZhoeu[facility] who [facility] ED [facility]. [facility]  [facility].   _________________________ 7:24 AM on 01/18/2020 -----------------------------------------  Sp[facility] with [facility] C[facility]Larson [facility] who has graciously accepted patient as a transfer.  Patient is pending transport.  Hemodynamically stable at this time.  Care transferred to Dr. Vicente MalesBradler     _____________________________________________ Please note:  Patient was evaluated in Emergency Department today for the symptoms described in the history of present illness. Patient was evaluated in the context of the global COVID-19 pandemic, which necessitated consideration that the patient might be at risk for infection with the SARS-CoV-2 virus that causes COVID-19. Institutional protocols and algorithms that pertain to the evaluation of patients at risk for COVID-19 are in a state of rapid change based on information released by regulatory bodies including the CDC and federal and state organizations. These policies and algorithms were followed during the patient's care in the ED.  Some ED evaluations and interventions may be delayed as a result of limited staffing during the pandemic.   [place] Controlled Substance Database was reviewed by me. ____________________________________________   FINAL CLINICAL IMPRESSION(S) / ED DIAGNOSES   Final diagnoses:  Perforated ulcer (HCC)  Sepsis without acute organ dysfunction, due to unspecified organism [facility](HCC)  UGIB (upper gastrointestinal bleed)      NEW MEDICATIONS STARTED DURING THIS VISIT:  ED Discharge Orders    None       Note:  This document was prepared using Dragon voice recognition software and may include unintentional dictation errors.    Don PerkingVeronese, [place], MD 01/18/20 [phone]

## 2020-01-18 NOTE — ED Notes (Signed)
Midwife at bedside

## 2020-01-19 LAB — TYPE AND SCREEN
ABO/RH(D): A POS
Antibody Screen: NEGATIVE
Unit division: 0
Unit division: 0

## 2020-01-19 LAB — CBC WITH DIFFERENTIAL/PLATELET
Abs Immature Granulocytes: 0.09 10*3/uL — ABNORMAL HIGH (ref 0.00–0.07)
Basophils Absolute: 0 10*3/uL (ref 0.0–0.1)
Basophils Relative: 0 %
Eosinophils Absolute: 0 10*3/uL (ref 0.0–0.5)
Eosinophils Relative: 0 %
HCT: 13.2 % — CL (ref 36.0–46.0)
Hemoglobin: 3.4 g/dL — CL (ref 12.0–15.0)
Immature Granulocytes: 1 %
Lymphocytes Relative: 8 %
Lymphs Abs: 0.8 10*3/uL (ref 0.7–4.0)
MCH: 17.7 pg — ABNORMAL LOW (ref 26.0–34.0)
MCHC: 25.8 g/dL — ABNORMAL LOW (ref 30.0–36.0)
MCV: 68.8 fL — ABNORMAL LOW (ref 80.0–100.0)
Monocytes Absolute: 1.1 10*3/uL — ABNORMAL HIGH (ref 0.1–1.0)
Monocytes Relative: 10 %
Neutro Abs: 8.9 10*3/uL — ABNORMAL HIGH (ref 1.7–7.7)
Neutrophils Relative %: 81 %
Platelets: 412 10*3/uL — ABNORMAL HIGH (ref 150–400)
RBC: 1.92 MIL/uL — ABNORMAL LOW (ref 3.87–5.11)
RDW: 19.2 % — ABNORMAL HIGH (ref 11.5–15.5)
WBC: 11 10*3/uL — ABNORMAL HIGH (ref 4.0–10.5)
nRBC: 0.4 % — ABNORMAL HIGH (ref 0.0–0.2)

## 2020-01-19 LAB — BPAM RBC
Blood Product Expiration Date: 202109242359
Blood Product Expiration Date: 202109302359
ISSUE DATE / TIME: 202109070639
Unit Type and Rh: 6200
Unit Type and Rh: 6200

## 2020-01-19 LAB — PREPARE RBC (CROSSMATCH)

## 2020-01-19 NOTE — Progress Notes (Signed)
PHARMACY - PHYSICIAN COMMUNICATION CRITICAL VALUE ALERT - BLOOD CULTURE IDENTIFICATION (BCID)  Tara Henry is an 34 y.o. female who presented to [facility] on 01/18/2020 with a chief complaint of abdominal pain  Assessment:  3/4 SIRS w/ left shift CT abdomen showing: IMPRESSION: 1. Increased diffuse abdominal ascites with some peritoneal enhancement. This raises concern for peritonitis. 2. Hyperdense material layering in the right posterior peritoneum raise concern for blood products. Although no free air is present, ulceration is considered. 3. Mildly distended loops of small bowel and transverse colon are likely related to ileus. 4. Postsurgical changes of gastric bypass. 5. Cholelithiasis without evidence for cholecystitis. 6. Mild splenomegaly. 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 Patient was airlifted to [facility] medical center.  Name of Pharmacist Contacted: Amr Soliman at [facility] inpatient pharmacy  Current antibiotics: Patient received one dose of zosyn 3.375g IV x 1 over 30 minutes in the ED.  Changes to prescribed antibiotics recommended:  Care to be resumed by [facility] trauma team.  Results for orders placed or performed during the hospital encounter of 01/18/20  Blood Culture ID Panel (Reflexed) (Collected: 01/18/2020  4:59 AM)  Result Value Ref Range   Enterococcus faecalis NOT DETECTED NOT DETECTED   Enterococcus Faecium NOT DETECTED NOT DETECTED   Listeria monocytogenes NOT DETECTED NOT DETECTED   Staphylococcus species DETECTED (A) NOT DETECTED   Staphylococcus aureus (BCID) DETECTED (A) NOT DETECTED   Staphylococcus epidermidis NOT DETECTED NOT DETECTED   Staphylococcus lugdunensis NOT DETECTED NOT DETECTED   Streptococcus species NOT DETECTED NOT DETECTED   Streptococcus agalactiae NOT DETECTED NOT DETECTED   Streptococcus pneumoniae NOT DETECTED NOT DETECTED   Streptococcus pyogenes NOT DETECTED NOT DETECTED   A.calcoaceticus-baumannii NOT DETECTED  NOT DETECTED   Bacteroides fragilis NOT DETECTED NOT DETECTED   Enterobacterales NOT DETECTED NOT DETECTED   Enterobacter cloacae complex NOT DETECTED NOT DETECTED   Escherichia coli NOT DETECTED NOT DETECTED   Klebsiella aerogenes NOT DETECTED NOT DETECTED   Klebsiella oxytoca NOT DETECTED NOT DETECTED   Klebsiella pneumoniae NOT DETECTED NOT DETECTED   Proteus species NOT DETECTED NOT DETECTED   Salmonella species NOT DETECTED NOT DETECTED   Serratia marcescens NOT DETECTED NOT DETECTED   Haemophilus influenzae NOT DETECTED NOT DETECTED   Neisseria meningitidis NOT DETECTED NOT DETECTED   Pseudomonas aeruginosa NOT DETECTED NOT DETECTED   Stenotrophomonas maltophilia NOT DETECTED NOT DETECTED   Candida albicans NOT DETECTED NOT DETECTED   Candida auris NOT DETECTED NOT DETECTED   Candida glabrata NOT DETECTED NOT DETECTED   Candida krusei NOT DETECTED NOT DETECTED   Candida parapsilosis NOT DETECTED NOT DETECTED   Candida tropicalis NOT DETECTED NOT DETECTED   Cryptococcus neoformans/gattii NOT DETECTED NOT DETECTED   Meth resistant mecA/C and MREJ NOT DETECTED NOT DETECTED   Thomasene Ripple, PharmD, BCPS Clinical Pharmacist 01/19/2020  12:07 AM

## 2020-01-21 LAB — CULTURE, BLOOD (ROUTINE X 2)
Special Requests: ADEQUATE
Special Requests: ADEQUATE

## 2021-10-21 IMAGING — CT CT ABD-PELV W/ CM
2 of 4 series · 14 of 46 positions shown, 16 images · IV contrast (APPLIED)
Comparison: CT of the abdomen and pelvis 11/16/2019

CLINICAL DATA: Abdominal pain, unspecified. Left-sided abdominal
back pain. Hematemesis. Rectal bleeding.

EXAM:
CT ABDOMEN AND PELVIS WITH CONTRAST
TECHNIQUE: Multidetector CT imaging of the abdomen and pelvis was performed
using the standard protocol following bolus administration of
intravenous contrast.
CONTRAST:  100mL OMNIPAQUE IOHEXOL 300 MG/ML  SOLN

[Series 2: routine abd/pel with · axial · 0.71mm/px · z∈[-1119,-699]mm · 11 of 100 slices shown, 13 images]
[im 8/100  soft-tissue]
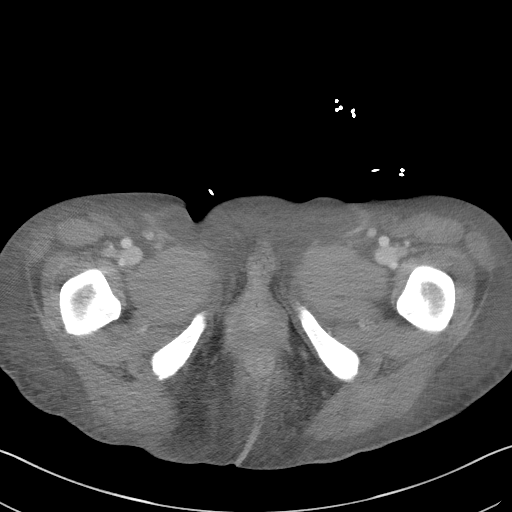
[im 8/100  bone]
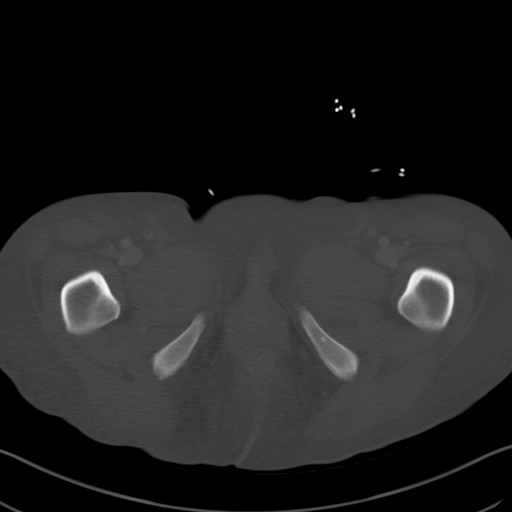
[im 16/100  soft-tissue]
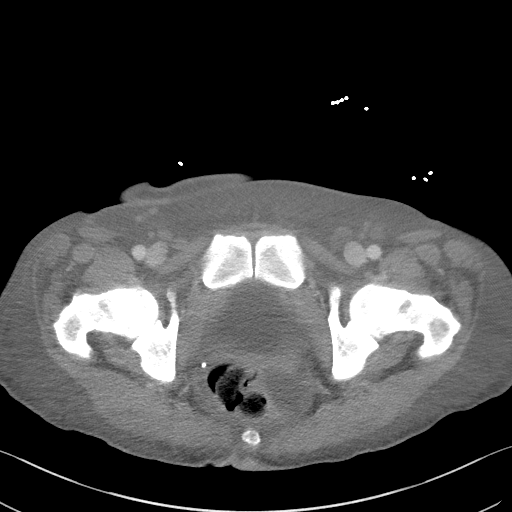
[im 24/100  soft-tissue]
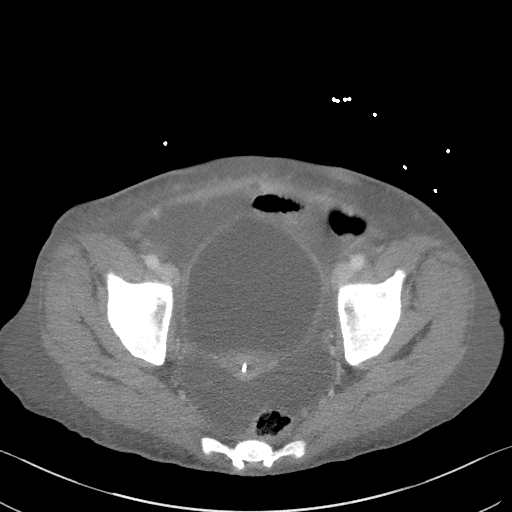
[im 32/100  soft-tissue]
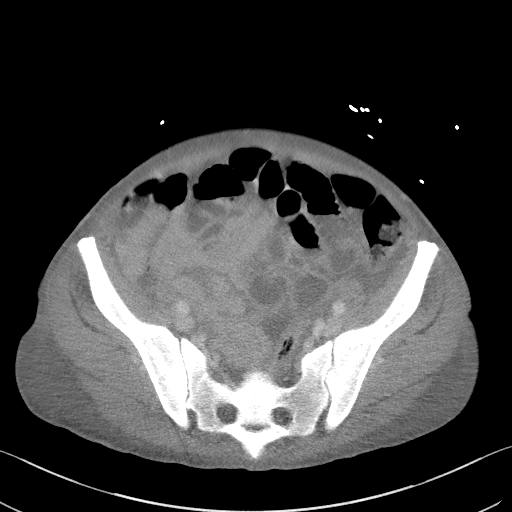
[im 40/100  soft-tissue]
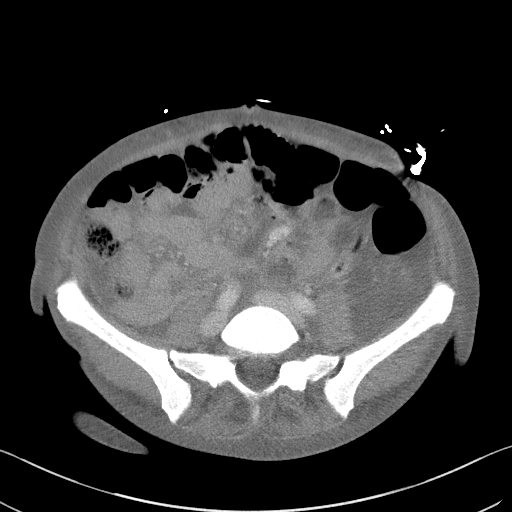
[im 52/100  soft-tissue]
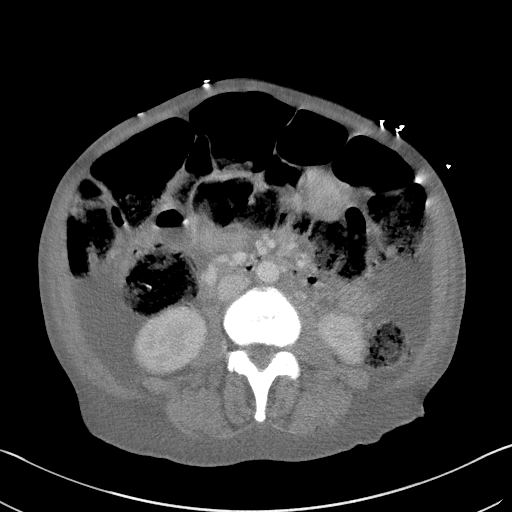
[im 60/100  soft-tissue]
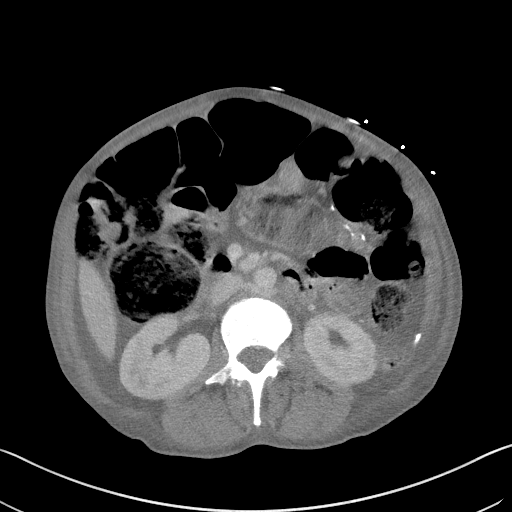
[im 68/100  soft-tissue]
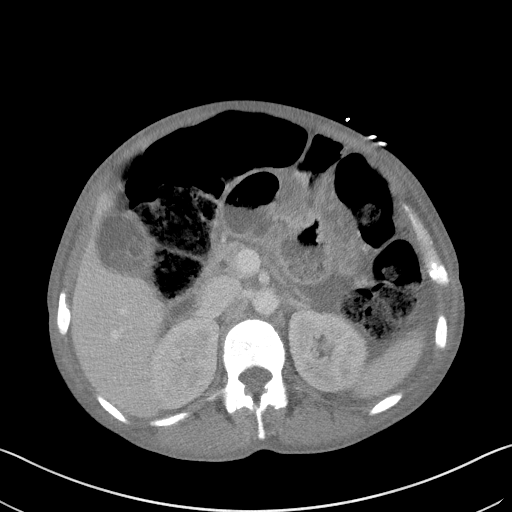
[im 76/100  soft-tissue]
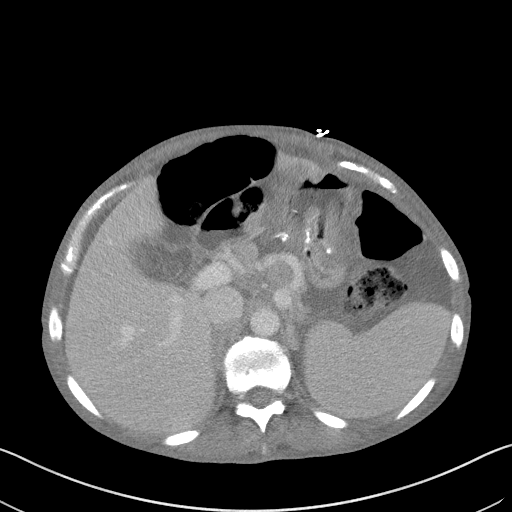
[im 76/100  bone]
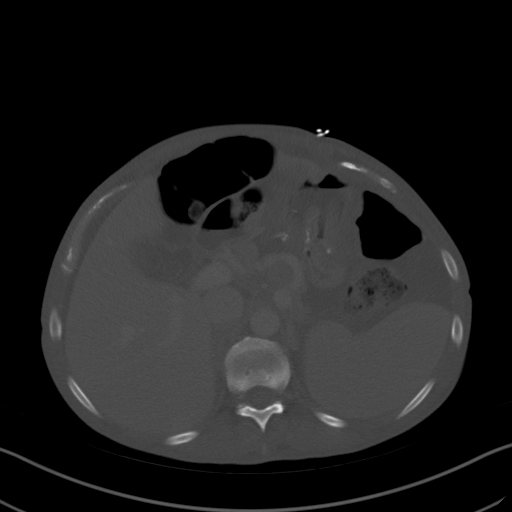
[im 84/100  soft-tissue]
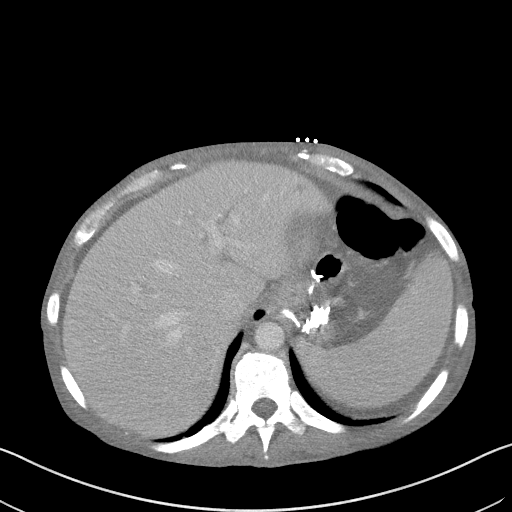
[im 92/100  soft-tissue]
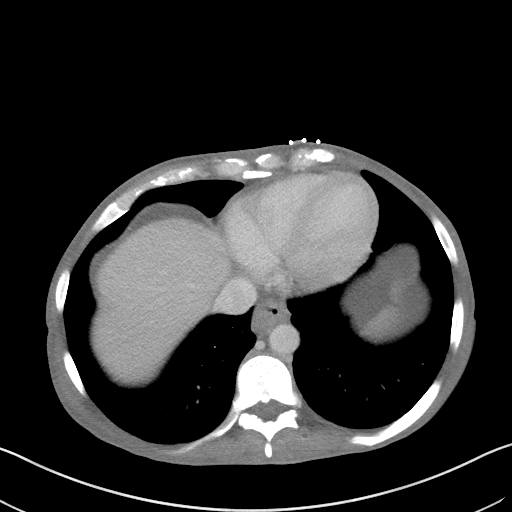

[Series 5: coronal st · coronal · 0.66mm/px · 3 of 94 slices shown]
[im 32/94  soft-tissue]
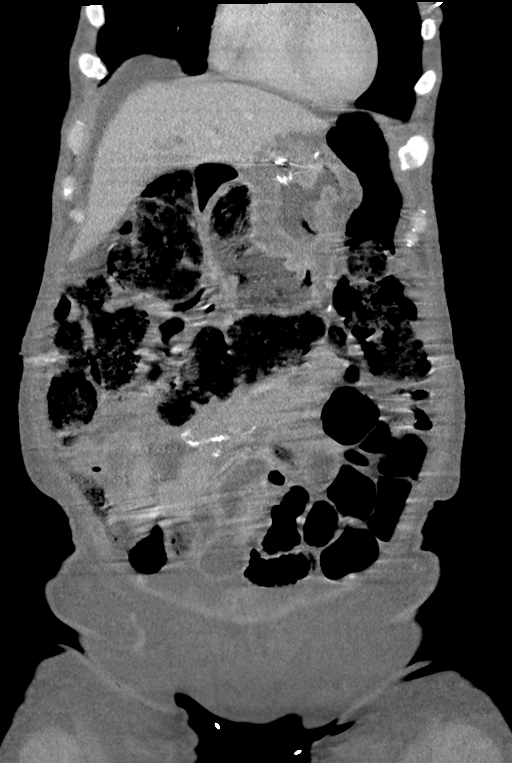
[im 42/94  soft-tissue]
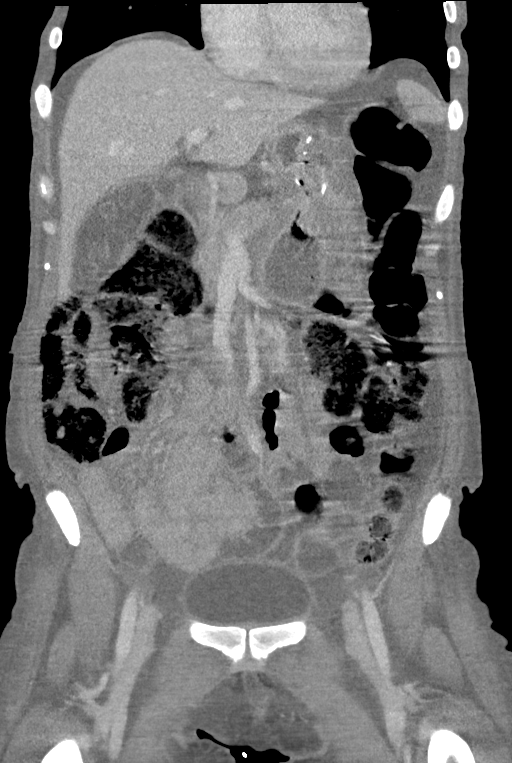
[im 52/94  soft-tissue]
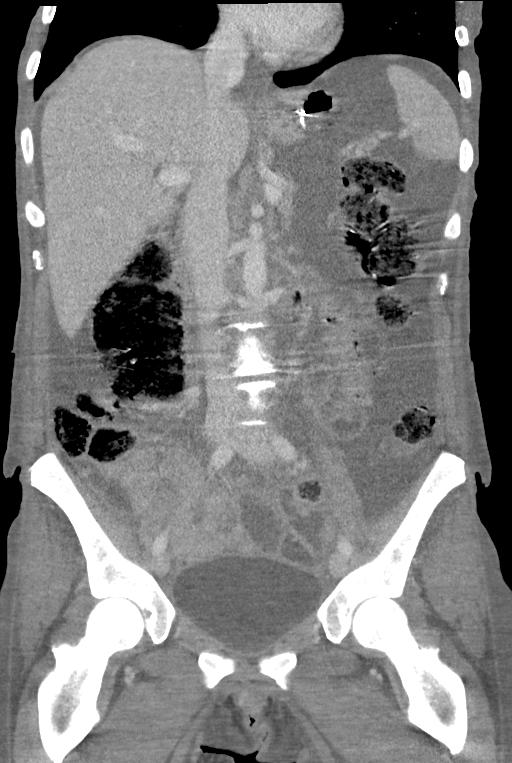

[14 of 46 positions shown; findings below may reference images not displayed]

FINDINGS: Lower chest: The lung bases are clear without focal nodule, mass, or
airspace disease. Heart size is normal. No significant pleural or
pericardial effusion is present.

Hepatobiliary: Liver is unremarkable. Gallstones are again noted.
The common bile duct and gallbladder are within normal limits.

Pancreas: Unremarkable. No pancreatic ductal dilatation or
surrounding inflammatory changes.

Spleen: Spleen is mildly enlarged.

Adrenals/Urinary Tract: Adrenal glands are unremarkable. Kidneys are
normal, without renal calculi, focal lesion, or hydronephrosis.
Bladder is unremarkable.

Stomach/Bowel: Postsurgical changes of gastric bypass are again
noted. Anastomosis is intact. Mildly distended loops of small bowel
are noted proximally. Distal small bowel is within normal limits.
The appendix is not discretely visualized. Moderate stool is present
in the ascending colon. Loops of transverse colon are dilated.
Descending and sigmoid colon are unremarkable.

Vascular/Lymphatic: No significant vascular findings are present. No
enlarged abdominal or pelvic lymph nodes.

Reproductive: IUD is in place. Uterus and adnexa are otherwise
unremarkable.

Other: Increased diffuse abdominal ascites are present. There is
some peritoneal enhancement. Layering density is noted on the right
without extension into the anatomic pelvis. No free air is present.
No mass lesion is present.

Musculoskeletal: Degenerative endplate changes are present in the
lumbar spine. No focal lytic or blastic lesions are present. Bony
pelvis is within normal limits. The hips are located and within
normal limits.
IMPRESSION: 1. Increased diffuse abdominal ascites with some peritoneal
enhancement. This raises concern for peritonitis.
2. Hyperdense material layering in the right posterior peritoneum
raise concern for blood products. Although no free air is present,
ulceration is considered.
3. Mildly distended loops of small bowel and transverse colon are
likely related to ileus.
4. Postsurgical changes of gastric bypass.
5. Cholelithiasis without evidence for cholecystitis.
6. Mild splenomegaly.

Critical Value/emergent results were called by telephone at the time
of interpretation on 01/18/2020 at [DATE] to provider [HOSPITAL]
[facility] , who verbally acknowledged these results.
